# Patient Record
Sex: Female | Born: 1935 | Race: White | Hispanic: No | Smoking: Never smoker
Health system: Southern US, Community
[De-identification: ages and names within clinical notes are randomized; demographics above are authoritative.]

## PROBLEM LIST (undated history)

## (undated) DIAGNOSIS — E119 Type 2 diabetes mellitus without complications: Secondary | ICD-10-CM

## (undated) HISTORY — PX: ABDOMINAL SURGERY: SHX537

## (undated) HISTORY — DX: Type 2 diabetes mellitus without complications: E11.9

---

## 2010-04-26 DIAGNOSIS — H524 Presbyopia: Secondary | ICD-10-CM | POA: Insufficient documentation

## 2010-05-11 DIAGNOSIS — Z961 Presence of intraocular lens: Secondary | ICD-10-CM | POA: Insufficient documentation

## 2010-05-11 DIAGNOSIS — H52209 Unspecified astigmatism, unspecified eye: Secondary | ICD-10-CM | POA: Insufficient documentation

## 2011-12-17 DIAGNOSIS — H35419 Lattice degeneration of retina, unspecified eye: Secondary | ICD-10-CM | POA: Insufficient documentation

## 2017-03-14 DIAGNOSIS — E782 Mixed hyperlipidemia: Secondary | ICD-10-CM | POA: Insufficient documentation

## 2017-04-16 DIAGNOSIS — K7689 Other specified diseases of liver: Secondary | ICD-10-CM | POA: Insufficient documentation

## 2017-11-28 DIAGNOSIS — R32 Unspecified urinary incontinence: Secondary | ICD-10-CM | POA: Insufficient documentation

## 2017-11-28 DIAGNOSIS — M545 Low back pain, unspecified: Secondary | ICD-10-CM | POA: Insufficient documentation

## 2017-11-28 DIAGNOSIS — R011 Cardiac murmur, unspecified: Secondary | ICD-10-CM | POA: Insufficient documentation

## 2017-11-28 DIAGNOSIS — G8929 Other chronic pain: Secondary | ICD-10-CM | POA: Insufficient documentation

## 2017-11-28 DIAGNOSIS — M2240 Chondromalacia patellae, unspecified knee: Secondary | ICD-10-CM | POA: Insufficient documentation

## 2018-03-11 ENCOUNTER — Ambulatory Visit (INDEPENDENT_AMBULATORY_CARE_PROVIDER_SITE_OTHER): Payer: Medicare Other | Admitting: Osteopathic Medicine

## 2018-03-11 ENCOUNTER — Encounter: Payer: Self-pay | Admitting: Osteopathic Medicine

## 2018-03-11 VITALS — BP 134/61 | HR 88 | Temp 98.8°F | Ht 63.0 in | Wt 163.6 lb

## 2018-03-11 DIAGNOSIS — R443 Hallucinations, unspecified: Secondary | ICD-10-CM | POA: Diagnosis not present

## 2018-03-11 DIAGNOSIS — G3109 Other frontotemporal dementia: Secondary | ICD-10-CM | POA: Diagnosis not present

## 2018-03-11 DIAGNOSIS — R413 Other amnesia: Secondary | ICD-10-CM | POA: Diagnosis not present

## 2018-03-11 DIAGNOSIS — E1165 Type 2 diabetes mellitus with hyperglycemia: Secondary | ICD-10-CM

## 2018-03-11 DIAGNOSIS — N3001 Acute cystitis with hematuria: Secondary | ICD-10-CM | POA: Diagnosis not present

## 2018-03-11 LAB — POCT URINALYSIS DIPSTICK
Glucose, UA: POSITIVE — AB
Ketones, UA: 15
NITRITE UA: POSITIVE
Protein, UA: POSITIVE — AB
Spec Grav, UA: 1.02 (ref 1.010–1.025)
Urobilinogen, UA: 1 E.U./dL
pH, UA: 7 (ref 5.0–8.0)

## 2018-03-11 MED ORDER — PREGABALIN 75 MG PO CAPS: 75.0000 mg | ORAL_CAPSULE | Freq: Two times a day (BID) | ORAL | 0 refills | Status: DC

## 2018-03-11 MED ORDER — CIPROFLOXACIN HCL 500 MG PO TABS: 500.0000 mg | ORAL_TABLET | Freq: Two times a day (BID) | ORAL | 0 refills | Status: DC

## 2018-03-11 NOTE — Patient Instructions (Addendum)
Plan: Blood work and urine culture Will start antibiotics for UTI Will schedule MRI brain  Will work on getting an appointment with a specialist Will request records from psychiatric evaluation  I'd like to see you back to discuss results and next steps

## 2018-03-11 NOTE — Progress Notes (Signed)
HPI: Stacy Mendoza is a 84 y.o. female who  has no past medical history on file.  she presents to [facility] today, 03/11/18,  for chief complaint of: New to establish - see headings below   NEUROLOGICAL/PSYCH Concern for hallucinations/confusion seem to happen at times of stress. She sees things tha aren't there, she has paranoid tendencies. History of anxiety. No other mental health history.    Son accompanies patient to appointment today.  He describes periods of time where she will hear or see people in the house that are not there.  There was a recent incident where they left her at home by herself while the rest of the family went out to the movies, she states that she saw her grandson in the house, he was not there.  She states she stayed in her room and was just watching out the window, noting about 20 cars parked outside.  Son has asked her to not open doors when they are not home, she states that she has not but they have her on camera opening and closing the front door about a dozen times  Have also been incidents of some kind of "breakdown" when she was living by herself in a trailer across from her other son in [place].  At that time was taken to what sounds like emergency department, admitted for psychiatric evaluation.  I am currently awaiting those records.   CARDIOVASCULAR Reports Hx abn heart rhythm, unknown diagnosis. HLD.   RESPIRATORY Coughing on occasion.   RENAL Last BUN/Cr ok 11/2017.   URINARY Frequency. Ongoing for better part of 6 months. Blood in urine is new today. No fever, no lower abdominal pain or flank pain.    GASTROINTESTINAL Dysphagia, "food sticking" sensation in throat Following with Dr. Conley Rolls for liver cyst and abdominal pain.  Stacy Carwin, MD  [address]  [address]  [place], [place] [address]  Phone: [phone]  Fax: [phone]  ENDOCRINE DM2 - last A1C 11/2017 was 8.5%, 03/2017 was 5.9%. Pt  is on Metformin and long acting insulin 6 units daily.   MUSCULOSKELETAL/RHEUM  HEENT Red splash in eyes, trouble with reading. Sees eye doctor   SKIN Redness on R big toe  HEME Last CBC ok 11/2017     Past medical, surgical, social and family history reviewed:  Patient Active Problem List   Diagnosis Date Noted  . Diabetes type 2, uncontrolled (HCC) 03/12/2018  . Chondromalacia patellae 11/28/2017  . Chronic left-sided low back pain 11/28/2017  . Heart murmur 11/28/2017  . Urinary incontinence 11/28/2017  . Liver cyst 04/16/2017  . Mixed hyperlipidemia 03/14/2017  . Retinal lattice degeneration 12/17/2011  . Astigmatism 05/11/2010  . History of artificial eye lens 05/11/2010  . Presbyopia 04/26/2010    Past Surgical History:  Procedure Laterality Date  . ABDOMINAL SURGERY      Social History   Tobacco Use  . Smoking status: Never Smoker  . Smokeless tobacco: Never Used  Substance Use Topics  . Alcohol use: Never    Frequency: Never    History reviewed. No pertinent family history.   Current medication list and allergy/intolerance information reviewed:    Current Meds  Medication Sig  . alendronate (FOSAMAX) 70 MG tablet Take by mouth.  . baclofen (LIORESAL) 10 MG tablet TAKE 1/2 TABLET AT BEDTIME  . DULoxetine (CYMBALTA) 60 MG capsule TAKE ONE CAPSULE BY MOUTH TWICE A DAY  . Insulin Glargine (LANTUS SOLOSTAR) 100 UNIT/ML  Solostar Pen INJECT 20 UNITS AT BEDTIME  . metFORMIN (GLUCOPHAGE-XR) 500 MG 24 hr tablet TAKE TWO (2) TABLETS BY MOUTH EVERY MORNING WITH BREAKFAST  . metoprolol succinate (TOPROL-XL) 25 MG 24 hr tablet TAKE ONE (1) TABLET BY MOUTH EACH DAY  . potassium chloride (K-DUR) 10 MEQ tablet TAKE ONE (1) TABLET BY MOUTH EACH DAY  . pregabalin (LYRICA) 75 MG capsule Take 1 capsule (75 mg total) by mouth 2 (two) times daily.  . rOPINIRoleMarland Kitchen (REQUIP) 2 MG tablet TAKE TWO TABLETS BY MOUTH AT BEDTIME DAILY  . simvastatin (ZOCOR) 20 MG tablet TAKE 1  TABLET BY MOUTH AT BEDTIME  . [DISCONTINUED] pregabalin (LYRICA) 75 MG capsule TAKE ONE (1) CAPSULE BY MOUTH 2 TIMES DAILY     No Known Allergies    Review of Systems:  Constitutional:  No  fever, no chills, No recent illness  HEENT: No  headache, no vision change, no hearing change, No sore throat, No  sinus pressure  Cardiac: No  chest pain, No  pressure, No palpitations  Respiratory:  No  shortness of breath. No  Cough  Gastrointestinal: No  abdominal pain, No  nausea, No  vomiting,  No  blood in stool, No  diarrhea, No  constipation   Musculoskeletal: No new myalgia/arthralgia  Genitourinary: +incontinence, No  abnormal genital bleeding, No abnormal genital discharge, +hematuria, +frequency  Hem/Onc: No  easy bruising/bleeding  Endocrine: No cold intolerance,  No heat intolerance. No polyuria/polydipsia/polyphagia   Neurologic: No  weakness, No  dizziness, No  slurred speech/focal weakness/facial droop  Psychiatric: No  concerns with depression, No  concerns with anxiety, No sleep problems, No mood problems  Exam:  BP 134/61 (BP Location: Left Arm, Patient Position: Sitting, Cuff Size: Normal)   Pulse 88   Temp 98.8 F (37.1 C) (Oral)   Ht 5\' 3"  (1.6 m)   Wt 163 lb 9.6 oz (74.2 kg)   BMI 28.98 kg/m   Constitutional: VS see above. General Appearance: alert, well-developed, well-nourished, NAD  Eyes: Normal lids and conjunctive, non-icteric sclera  Ears, Nose, Mouth, Throat: MMM, Normal external inspection ears/nares/mouth/lips/gums.   Neck: No masses, trachea midline. No thyroid enlargement. No tenderness/mass appreciated. No lymphadenopathy  Respiratory: Normal respiratory effort. no wheeze, no rhonchi, no rales  Cardiovascular: S1/S2 normal, +3-6 systolic murmur, no rub/gallop auscultated. RRR. No lower extremity edema.   Gastrointestinal: Nontender, no masses  Musculoskeletal: Gait normal. No clubbing/cyanosis of digits.   Neurological: Normal  balance/coordination. No tremor. No cranial nerve deficit on limited exam. Motor and sensation intact and symmetric. Cerebellar reflexes intact. Tic-like movements of mouth almost constantly.   Skin: warm, dry, intact.   Psychiatric: Poor judgment/insight. Normal mood and affect. Oriented xperson, place. She is pleasant, cooperative, does not appear confused now. S    Results for orders placed or performed in visit on 03/11/18 (from the past 24 hour(s))  POCT Urinalysis Dipstick     Status: Abnormal   Collection Time: 03/11/18  2:38 PM  Result Value Ref Range   Color, UA RED    Clarity, UA TURBID    Glucose, UA Positive (A) Negative   Bilirubin, UA LARGE    Ketones, UA 15    Spec Grav, UA 1.020 1.010 - 1.025   Blood, UA LARGE    pH, UA 7.0 5.0 - 8.0   Protein, UA Positive (A) Negative   Urobilinogen, UA 1.0 0.2 or 1.0 E.U./dL   Nitrite, UA POSITIVE    Leukocytes, UA Large (3+) (A) Negative  Appearance     Odor    CBC with Differential/Platelet     Status: Abnormal   Collection Time: 03/11/18  3:04 PM  Result Value Ref Range   WBC 13.9 (H) 3.8 - 10.8 Thousand/uL   RBC 4.74 3.80 - 5.10 Million/uL   Hemoglobin 13.6 11.7 - 15.5 g/dL   HCT 16.140.8 09.635.0 - 04.545.0 %   MCV 86.1 80.0 - 100.0 fL   MCH 28.7 27.0 - 33.0 pg   MCHC 33.3 32.0 - 36.0 g/dL   RDW 40.913.7 81.111.0 - 91.415.0 %   Platelets 276 140 - 400 Thousand/uL   MPV 10.7 7.5 - 12.5 fL   Neutro Abs 10,439 (H) 1,500 - 7,800 cells/uL   Lymphs Abs 2,405 850 - 3,900 cells/uL   Absolute Monocytes 765 200 - 950 cells/uL   Eosinophils Absolute 195 15 - 500 cells/uL   Basophils Absolute 97 0 - 200 cells/uL   Neutrophils Relative % 75.1 %   Total Lymphocyte 17.3 %   Monocytes Relative 5.5 %   Eosinophils Relative 1.4 %   Basophils Relative 0.7 %  COMPLETE METABOLIC PANEL WITH GFR     Status: Abnormal   Collection Time: 03/11/18  3:04 PM  Result Value Ref Range   Glucose, Bld 272 (H) 65 - 99 mg/dL   BUN 17 7 - 25 mg/dL   Creat 7.821.01 (H)  9.560.60 - 0.88 mg/dL   GFR, Est Non African American 52 (L) > OR = 60 mL/min/1.3173m2   GFR, Est African American 60 > OR = 60 mL/min/1.7573m2   BUN/Creatinine Ratio 17 6 - 22 (calc)   Sodium 137 135 - 146 mmol/L   Potassium 4.2 3.5 - 5.3 mmol/L   Chloride 101 98 - 110 mmol/L   CO2 23 20 - 32 mmol/L   Calcium 9.0 8.6 - 10.4 mg/dL   Total Protein 7.0 6.1 - 8.1 g/dL   Albumin 4.1 3.6 - 5.1 g/dL   Globulin 2.9 1.9 - 3.7 g/dL (calc)   AG Ratio 1.4 1.0 - 2.5 (calc)   Total Bilirubin 1.4 (H) 0.2 - 1.2 mg/dL   Alkaline phosphatase (APISO) 82 33 - 130 U/L   AST 19 10 - 35 U/L   ALT 8 6 - 29 U/L  TSH     Status: None   Collection Time: 03/11/18  3:04 PM  Result Value Ref Range   TSH 1.40 0.40 - 4.50 mIU/L  T4, free     Status: None   Collection Time: 03/11/18  3:04 PM  Result Value Ref Range   Free T4 1.1 0.8 - 1.8 ng/dL  Hemoglobin O1HA1c     Status: Abnormal   Collection Time: 03/11/18  3:04 PM  Result Value Ref Range   Hgb A1c MFr Bld 12.3 (H) <5.7 % of total Hgb   Mean Plasma Glucose 306 (calc)   eAG (mmol/L) 17.0 (calc)  Vitamin B12     Status: None   Collection Time: 03/11/18  3:04 PM  Result Value Ref Range   Vitamin B-12 448 200 - 1,100 pg/mL  Sedimentation rate     Status: None   Collection Time: 03/11/18  3:04 PM  Result Value Ref Range   Sed Rate 25 0 - 30 mm/h      ASSESSMENT/PLAN: The primary encounter diagnosis was Hallucinations. Diagnoses of Acute cystitis with hematuria, Functional memory problem, Other frontotemporal dementia (CODE) (HCC), and Uncontrolled type 2 diabetes mellitus with hyperglycemia (HCC) were also pertinent to this visit.  Patient and her son are very pleasant people, this is an unfortunate case.  Hallucinations are definitely concerning for possible frontotemporal dementia or other organic brain disease, could certainly also be related to UTI, though this seems less likely given the duration of her symptoms.  In the absence of medical/psychiatric history  to explain hallucinations, will await labs and MRI, strong consideration for a geriatric psychiatry and/or neurology.  We will see if symptoms improve with treatment for UTI.  Getting the sugars under control is also going to be a bit of a process.   Orders Placed This Encounter  Procedures  . Urine Culture  . MR Brain Wo Contrast  . CBC with Differential/Platelet  . COMPLETE METABOLIC PANEL WITH GFR  . TSH  . T4, free  . Hemoglobin A1c  . Hepatitis C antibody  . Vitamin B12  . Folate RBC  . RPR  . HIV Antibody (routine testing w rflx)  . Sedimentation rate  . POCT Urinalysis Dipstick    Meds ordered this encounter  Medications  . pregabalin (LYRICA) 75 MG capsule    Sig: Take 1 capsule (75 mg total) by mouth 2 (two) times daily.    Dispense:  60 capsule    Refill:  0  . ciprofloxacin (CIPRO) 500 MG tablet    Sig: Take 1 tablet (500 mg total) by mouth 2 (two) times daily.    Dispense:  20 tablet    Refill:  0    Patient Instructions  Plan: Blood work and urine culture Will start antibiotics for UTI Will schedule MRI brain  Will work on getting an appointment with a specialist Will request records from psychiatric evaluation  I'd like to see you back to discuss results and next steps     Result nore: titrate up on insulin       Visit summary with medication list and pertinent instructions was printed for patient to review. All questions at time of visit were answered - patient instructed to contact office with any additional concerns or updates. ER/RTC precautions were reviewed with the patient.   Note: Total time spent 60 minutes, greater than 50% of the visit was spent face-to-face counseling and coordinating care for the above diagnoses listed in assessment/plan.   Please note: voice recognition software was used to produce this document, and typos may escape review. Please contact Dr. Lyn Hollingshead for any needed clarifications.     Follow-up plan: Return in  about 2 weeks (around 03/25/2018) for review results - see me sooner if needed .

## 2018-03-12 ENCOUNTER — Encounter: Payer: Self-pay | Admitting: Osteopathic Medicine

## 2018-03-12 DIAGNOSIS — E1165 Type 2 diabetes mellitus with hyperglycemia: Secondary | ICD-10-CM | POA: Insufficient documentation

## 2018-03-12 DIAGNOSIS — IMO0002 Reserved for concepts with insufficient information to code with codable children: Secondary | ICD-10-CM | POA: Insufficient documentation

## 2018-03-12 LAB — CBC WITH DIFFERENTIAL/PLATELET
Absolute Monocytes: 765 cells/uL (ref 200–950)
Basophils Absolute: 97 cells/uL (ref 0–200)
Basophils Relative: 0.7 %
EOS PCT: 1.4 %
Eosinophils Absolute: 195 cells/uL (ref 15–500)
HCT: 40.8 % (ref 35.0–45.0)
Hemoglobin: 13.6 g/dL (ref 11.7–15.5)
Lymphs Abs: 2405 cells/uL (ref 850–3900)
MCH: 28.7 pg (ref 27.0–33.0)
MCHC: 33.3 g/dL (ref 32.0–36.0)
MCV: 86.1 fL (ref 80.0–100.0)
MPV: 10.7 fL (ref 7.5–12.5)
Monocytes Relative: 5.5 %
Neutro Abs: 10439 cells/uL — ABNORMAL HIGH (ref 1500–7800)
Neutrophils Relative %: 75.1 %
Platelets: 276 10*3/uL (ref 140–400)
RBC: 4.74 10*6/uL (ref 3.80–5.10)
RDW: 13.7 % (ref 11.0–15.0)
Total Lymphocyte: 17.3 %
WBC: 13.9 10*3/uL — ABNORMAL HIGH (ref 3.8–10.8)

## 2018-03-12 LAB — RPR: RPR Ser Ql: NONREACTIVE

## 2018-03-12 LAB — COMPLETE METABOLIC PANEL WITH GFR
AG Ratio: 1.4 (calc) (ref 1.0–2.5)
ALBUMIN MSPROF: 4.1 g/dL (ref 3.6–5.1)
ALT: 8 U/L (ref 6–29)
AST: 19 U/L (ref 10–35)
Alkaline phosphatase (APISO): 82 U/L (ref 33–130)
BUN / CREAT RATIO: 17 (calc) (ref 6–22)
BUN: 17 mg/dL (ref 7–25)
CO2: 23 mmol/L (ref 20–32)
Calcium: 9 mg/dL (ref 8.6–10.4)
Chloride: 101 mmol/L (ref 98–110)
Creat: 1.01 mg/dL — ABNORMAL HIGH (ref 0.60–0.88)
GFR, Est African American: 60 mL/min/{1.73_m2} (ref >=60)
GFR, Est Non African American: 52 mL/min/{1.73_m2} — ABNORMAL LOW (ref >=60)
GLOBULIN: 2.9 g/dL (ref 1.9–3.7)
Glucose, Bld: 272 mg/dL — ABNORMAL HIGH (ref 65–99)
Potassium: 4.2 mmol/L (ref 3.5–5.3)
Sodium: 137 mmol/L (ref 135–146)
Total Bilirubin: 1.4 mg/dL — ABNORMAL HIGH (ref 0.2–1.2)
Total Protein: 7 g/dL (ref 6.1–8.1)

## 2018-03-12 LAB — T4, FREE: Free T4: 1.1 ng/dL (ref 0.8–1.8)

## 2018-03-12 LAB — HEPATITIS C ANTIBODY
Hepatitis C Ab: NONREACTIVE
SIGNAL TO CUT-OFF: 0.02 (ref <1.00)

## 2018-03-12 LAB — SEDIMENTATION RATE: Sed Rate: 25 mm/h (ref 0–30)

## 2018-03-12 LAB — HIV ANTIBODY (ROUTINE TESTING W REFLEX): HIV 1&2 Ab, 4th Generation: NONREACTIVE

## 2018-03-12 LAB — HEMOGLOBIN A1C
Hgb A1c MFr Bld: 12.3 % of total Hgb — ABNORMAL HIGH (ref <5.7)
Mean Plasma Glucose: 306 (calc)
eAG (mmol/L): 17 (calc)

## 2018-03-12 LAB — VITAMIN B12: Vitamin B-12: 448 pg/mL (ref 200–1100)

## 2018-03-12 LAB — FOLATE RBC: RBC Folate: 760 ng/mL RBC (ref >280)

## 2018-03-12 LAB — TSH: TSH: 1.4 mIU/L (ref 0.40–4.50)

## 2018-03-13 LAB — URINE CULTURE
MICRO NUMBER:: 54369
SPECIMEN QUALITY:: ADEQUATE

## 2018-03-17 ENCOUNTER — Ambulatory Visit (INDEPENDENT_AMBULATORY_CARE_PROVIDER_SITE_OTHER): Payer: Medicare Other

## 2018-03-17 DIAGNOSIS — G3109 Other frontotemporal dementia: Secondary | ICD-10-CM

## 2018-03-24 ENCOUNTER — Telehealth: Payer: Self-pay

## 2018-03-24 NOTE — Telephone Encounter (Signed)
Pt's son Nida Boatman called requesting a med refill for lantus solostar. Written by historical provider. Pls send rx to [facility] in [place].

## 2018-03-26 MED ORDER — INSULIN GLARGINE 100 UNIT/ML SOLOSTAR PEN: 20.0000 [IU] | PEN_INJECTOR | Freq: Every day | SUBCUTANEOUS | 99 refills | Status: DC

## 2018-03-26 NOTE — Telephone Encounter (Signed)
Sent!

## 2018-03-26 NOTE — Telephone Encounter (Signed)
Left a detailed vm msg for pt's son Nida Boatman regarding med refill for insulin pens. Direct call back info provided.

## 2018-04-01 ENCOUNTER — Ambulatory Visit: Payer: Medicare Other | Admitting: Osteopathic Medicine

## 2018-04-01 ENCOUNTER — Telehealth: Payer: Self-pay | Admitting: Osteopathic Medicine

## 2018-04-01 NOTE — Telephone Encounter (Signed)
Patient walked in at 3:40pm. Son rescheduled  appointment.

## 2018-04-09 ENCOUNTER — Ambulatory Visit: Payer: Medicare Other | Admitting: Osteopathic Medicine

## 2018-04-22 ENCOUNTER — Encounter: Payer: Self-pay | Admitting: Osteopathic Medicine

## 2018-04-22 ENCOUNTER — Ambulatory Visit (INDEPENDENT_AMBULATORY_CARE_PROVIDER_SITE_OTHER): Payer: Medicare Other | Admitting: Osteopathic Medicine

## 2018-04-22 ENCOUNTER — Ambulatory Visit (INDEPENDENT_AMBULATORY_CARE_PROVIDER_SITE_OTHER): Payer: Medicare Other

## 2018-04-22 VITALS — BP 132/68 | HR 72 | Temp 99.0°F | Wt 171.0 lb

## 2018-04-22 DIAGNOSIS — R0602 Shortness of breath: Secondary | ICD-10-CM | POA: Diagnosis not present

## 2018-04-22 DIAGNOSIS — I10 Essential (primary) hypertension: Secondary | ICD-10-CM | POA: Diagnosis not present

## 2018-04-22 DIAGNOSIS — B351 Tinea unguium: Secondary | ICD-10-CM | POA: Diagnosis not present

## 2018-04-22 DIAGNOSIS — R410 Disorientation, unspecified: Secondary | ICD-10-CM

## 2018-04-22 DIAGNOSIS — F0390 Unspecified dementia without behavioral disturbance: Secondary | ICD-10-CM | POA: Insufficient documentation

## 2018-04-22 DIAGNOSIS — F0281 Dementia in other diseases classified elsewhere with behavioral disturbance: Secondary | ICD-10-CM

## 2018-04-22 DIAGNOSIS — G3109 Other frontotemporal dementia: Secondary | ICD-10-CM

## 2018-04-22 DIAGNOSIS — R0600 Dyspnea, unspecified: Secondary | ICD-10-CM | POA: Diagnosis not present

## 2018-04-22 DIAGNOSIS — N3001 Acute cystitis with hematuria: Secondary | ICD-10-CM | POA: Diagnosis not present

## 2018-04-22 DIAGNOSIS — E1165 Type 2 diabetes mellitus with hyperglycemia: Secondary | ICD-10-CM

## 2018-04-22 DIAGNOSIS — L03031 Cellulitis of right toe: Secondary | ICD-10-CM

## 2018-04-22 MED ORDER — PREGABALIN 75 MG PO CAPS: 75.0000 mg | ORAL_CAPSULE | Freq: Two times a day (BID) | ORAL | 5 refills | Status: AC

## 2018-04-22 MED ORDER — SIMVASTATIN 20 MG PO TABS: 20.0000 mg | ORAL_TABLET | Freq: Every day | ORAL | 3 refills | Status: AC

## 2018-04-22 MED ORDER — FLUCONAZOLE 150 MG PO TABS: 150.0000 mg | ORAL_TABLET | Freq: Once | ORAL | 1 refills | Status: AC

## 2018-04-22 MED ORDER — METFORMIN HCL ER 500 MG PO TB24: ORAL_TABLET | ORAL | 3 refills | Status: AC

## 2018-04-22 MED ORDER — ALENDRONATE SODIUM 70 MG PO TABS: 70.0000 mg | ORAL_TABLET | ORAL | 3 refills | Status: AC

## 2018-04-22 MED ORDER — TERBINAFINE HCL 1 % EX CREA: TOPICAL_CREAM | CUTANEOUS | 3 refills | Status: AC

## 2018-04-22 MED ORDER — POTASSIUM CHLORIDE ER 10 MEQ PO TBCR: 10.0000 meq | EXTENDED_RELEASE_TABLET | Freq: Every day | ORAL | 3 refills | Status: AC

## 2018-04-22 MED ORDER — CICLOPIROX 8 % EX SOLN: CUTANEOUS | 11 refills | Status: AC

## 2018-04-22 MED ORDER — METOPROLOL SUCCINATE ER 25 MG PO TB24: 25.0000 mg | ORAL_TABLET | Freq: Every day | ORAL | 3 refills | Status: AC

## 2018-04-22 MED ORDER — INSULIN GLARGINE 100 UNIT/ML SOLOSTAR PEN: PEN_INJECTOR | SUBCUTANEOUS | 99 refills | Status: AC

## 2018-04-22 MED ORDER — DULOXETINE HCL 60 MG PO CPEP: 60.0000 mg | ORAL_CAPSULE | Freq: Two times a day (BID) | ORAL | 3 refills | Status: AC

## 2018-04-22 MED ORDER — ROPINIROLE HCL 2 MG PO TABS: ORAL_TABLET | ORAL | 3 refills | Status: AC

## 2018-04-22 MED ORDER — SULFAMETHOXAZOLE-TRIMETHOPRIM 800-160 MG PO TABS: 1.0000 | ORAL_TABLET | Freq: Two times a day (BID) | ORAL | 0 refills | Status: AC

## 2018-04-22 NOTE — Progress Notes (Signed)
HPI: Stacy Mendoza is a 84 y.o. female who  has a past medical history of Diabetes (HCC).  [facility], 04/22/18,  for chief complaint of:  Follow-up: confusion/hallucinations, UTI New: vaginal itching, rash on R great toe, shortness of breath  Confusion/hallucinations, UTI: Proteus, tx Cipro.  Son reports hallucinations have essentially resolved but she still experiences confusion, agitation, waking up in the middle of the night and not knowing what time it is, he has to manage all of her medications.  Recent lab results, consistent with UTI, CKD versus AKI, proteinuria.  MRI showed evidence of frontotemporal dementia and micro vascular chronic change, no previous CVA  Vaginal itching: Reports itching/burning sensation, no urinary frequency.  Longstanding incontinence, patient feels urge to go, some stress incontinence as well.   Rash on right great toe, getting worse over the past week or so, she has neuropathy and cannot feel her feet very well but she does examine these regularly.  No ulcerations or wounds.  Shortness of breath, she is worried since her grandkids have recently been sick, no fever, she reports increased coughing.  Non-smoker and no history of occupational respiratory irritant exposure.  Diabetes: She is taking insulin 10 units daily, not checking blood glucose.  Recent A1c on labs greater than 12.       At today's visit 04/22/18 ... PMH, PSH, FH reviewed and updated as needed.  Current medication list and allergy/intolerance hx reviewed and updated as needed. (See remainder of HPI, ROS, Phys Exam below)   EKG interpretation: Rate: 72  Rhythm: sinus w/ 1st degree AVB No ST/T changes concerning for acute ischemia/infarct  Previous EKG none available        ASSESSMENT/PLAN: The primary encounter diagnosis was Acute cystitis with hematuria. Diagnoses of Delirium, Other frontotemporal dementia with  behavioral disturbance (HCC), Onychomycosis, Cellulitis of right toe, Uncontrolled type 2 diabetes mellitus with hyperglycemia (HCC), Dyspnea, unspecified type, and Shortness of breath were also pertinent to this visit.   Orders Placed This Encounter  Procedures  . Urine Culture  . DG Chest 2 View  . Urinalysis, microscopic only  . ECHOCARDIOGRAM COMPLETE  . Rhythm ECG, report     New or changed meds in bold  Meds ordered this encounter  Medications  . sulfamethoxazole-trimethoprim (BACTRIM DS,SEPTRA DS) 800-160 MG tablet    Sig: Take 1 tablet by mouth 2 (two) times daily.    Dispense:  14 tablet    Refill:  0  . fluconazole (DIFLUCAN) 150 MG tablet    Sig: Take 1 tablet (150 mg total) by mouth once for 1 dose. Repeat dose 72 hours if yeast infection persists    Dispense:  2 tablet    Refill:  1  . alendronate (FOSAMAX) 70 MG tablet    Sig: Take 1 tablet (70 mg total) by mouth once a week.    Dispense:  12 tablet    Refill:  3  . DULoxetine (CYMBALTA) 60 MG capsule    Sig: Take 1 capsule (60 mg total) by mouth 2 (two) times daily.    Dispense:  90 capsule    Refill:  3  . Insulin Glargine (LANTUS SOLOSTAR) 100 UNIT/ML Solostar Pen    Sig: Titrate up as directed to max 60 units    Dispense:  15 mL    Refill:  99  . metFORMIN (GLUCOPHAGE-XR) 500 MG 24 hr tablet    Sig: TAKE TWO (2) TABLETS BY MOUTH EVERY MORNING  WITH BREAKFAST    Dispense:  180 tablet    Refill:  3  . metoprolol succinate (TOPROL-XL) 25 MG 24 hr tablet    Sig: Take 1 tablet (25 mg total) by mouth daily.    Dispense:  90 tablet    Refill:  3  . potassium chloride (K-DUR) 10 MEQ tablet    Sig: Take 1 tablet (10 mEq total) by mouth daily.    Dispense:  90 tablet    Refill:  3  . pregabalin (LYRICA) 75 MG capsule    Sig: Take 1 capsule (75 mg total) by mouth 2 (two) times daily.    Dispense:  60 capsule    Refill:  5  . rOPINIRole (REQUIP) 2 MG tablet    Sig: TAKE TWO TABLETS BY MOUTH AT BEDTIME DAILY     Dispense:  180 tablet    Refill:  3  . simvastatin (ZOCOR) 20 MG tablet    Sig: Take 1 tablet (20 mg total) by mouth at bedtime.    Dispense:  90 tablet    Refill:  3  . ciclopirox (PENLAC) 8 % solution    Sig: Apply over nail and surrounding skin daily over previous coat. After seven (7) days, may remove with alcohol and continue cycle.    Dispense:  6.6 mL    Refill:  11  . terbinafine (LAMISIL) 1 % cream    Sig: Apply to affected area BID until rash gone, then apply 2 more days.    Dispense:  30 g    Refill:  3    Patient Instructions  For Diabetes  Continue Metformin as you are taking it  Measure fasting blood sugar every day: goal for now is to get this to 100-130  Increase daily Insulin dose by 3 units at a time, twice per week, until fasting sugars are consistently 100-130, then continue at that dose  Plan to recheck A1C in 3 months  Plan to follow-up in the office sooner if you experience low sugars   Plan to follow-up in the office sooner if your fasting sugars are consistently high  Bring all sugar readings with you to your office visits  For feet:  Antibiotics (sulfamethoxazole-trimethoprim) for cellulitis infection of toe  Ciclopirox polish to nails for nail fungus, may require several months of treatment  Lamisil cream to skin around toes, use for 2-4 weeks   Yeast infection:  Sent Diflucan to pharmacy   Breathing:  Will schedule echocardiogram (ultrasound of the heart)  Will get Xray today   Consider follow-up lung function test   Refilled all other medications             Frontotemporal Dementia  Frontotemporal dementia (FTD) is a rare, progressive brain disorder that causes memory loss. FTD describes a range of diseases that often start with changes in behavior, speech, and thinking. As FTD progresses, it affects short-term memory. Over time, FTD causes the frontal and temporal anterior lobes of the brain to shrink. These are the  parts of the brain that control behavior and speech. There are three main types of FTD:  Behavioral variant FTD. This is the most common type and was previously called Pick disease.  Progressive agrammatic aphasia.  Semantic variant primary progressive aphasia. FTD is one of the most common causes of progressive memory loss in people younger than 60 years. The disease progresses faster in some people than in others. In some families, FTD can be associated with amyotrophic lateral sclerosis (  ALS). There is no cure for FTD, but treatment and supportive care can improve a person's quality of life. What are the causes? The exact cause of this condition is not known, although many genetic mutations are known to cause the disease. What increases the risk? This condition is more likely to occur in people who have a family history of FTD. Family members of people with FTD should think about genetic counseling. What are the signs or symptoms? Signs and symptoms of FTD usually start between the ages of 55-65 years. Symptoms may include:  Impulsive, poorly controlled, inappropriate, or embarrassing behavior.  Lack of motivation and interest (apathy).  Irritability and agitation.  Neglect of personal hygiene.  Withdrawal.  Inappropriate crying or laughing (pseudobulbar affect).  Repetitive behaviors.  Impulsive eating  Lack of concern for others.  Failure to recognize behavioral changes.  Loss of the ability to speak fluently.  Inability to write or read.  Slurred speech.  Loss of vocabulary.  New drug or alcohol abuse. Short-term memory loss is also a symptom later in the disease. How is this diagnosed? Your health care provider may suspect FTD if you have worsening behavior or speech difficulties. You may need to see specialists in brain and behavioral health who will collect your medical history and do a neurological examination. The following tests may be done:  Blood tests  to rule out other causes, like vitamin deficiency, harmful effects of substances (toxicities), and infections.  Spinal tap (lumbar puncture) to check spinal fluid samples for abnormal proteins.  Imaging tests, such as a CT scan, PET scan, or MRI. These can show brain changes that suggest FTD or another brain disorder.  Memory testing (neuropsychological testing), which involves several hours of standardized tests to check the many functions of the brain. How is this treated? There is no cure for this condition and the progression of FTD cannot be stopped. Support at home is the most important aspect of managing FTD. Ask about caregiving resources in your community. Management of this condition may include:  Antidepressants to help with apathy.  Medicines to treat pseudobulbar affect.  Sedative medicines to control aggressive or dangerous behavior.  Speech and language therapy.  Behavioral therapy.  Occupational therapy to help with home safety and activity. Institutional or supportive care may eventually be needed. Follow these instructions at home: Eating and drinking  Follow a healthy diet. Eat foods that are high in fiber, such as fresh fruits and vegetables, whole grains, and beans.  Avoid having too much sugar and caffeine in your diet.  Watch for signs of compulsive eating, which can lead to other health problems.  Do not drink alcohol.  Drink enough fluid to keep your urine pale yellow. Lifestyle  Keep a regular routine.  Avoid stress and new situations.  Avoid any activities that may trigger aggressive behavior.  Schedule regular, enjoyable, and supervised physical activity. General instructions  Take over-the-counter and prescription medicines only as told by your health care provider.  If you were given a bracelet that tracks your location, make sure you wear it.  Work with your health care provider to determine what you need help with and what your safety  needs are.  Keep all follow-up visits, including any therapy visits, as told by your health care provider. This is important. Contact a health care provider if:  Your symptoms change or get worse.  It becomes more difficult or stressful to be cared for at home. Get help right away if:  It is no longer possible to be cared for at home.  You or your family members become concerned for your safety. Summary  Frontotemporal dementia (FTD) is a rare, progressive brain disorder that causes memory loss.  There is no cure for this condition and the progression of FTD cannot be stopped.  Support at home is the most important aspect of managing FTD. Ask about caregiving resources in your community.  Work with your health care provider to determine what you need help with and what your safety needs are. This information is not intended to replace advice given to you by your health care provider. Make sure you discuss any questions you have with your health care provider. Document Released: 02/02/2002 Document Revised: 05/08/2016 Document Reviewed: 05/08/2016 Elsevier Interactive Patient Education  2019 [employer].      Follow-up plan: Return in about 1 week (around 04/29/2018) for recheck toe and sugars, A1C recheck around 07/21/2018.                                                 ################################################# ################################################# ################################################# #################################################    Current Meds  Medication Sig  . alendronate (FOSAMAX) 70 MG tablet Take 1 tablet (70 mg total) by mouth once a week.  . baclofen (LIORESAL) 10 MG tablet TAKE 1/2 TABLET AT BEDTIME  . DULoxetine (CYMBALTA) 60 MG capsule Take 1 capsule (60 mg total) by mouth 2 (two) times daily.  . Insulin Glargine (LANTUS SOLOSTAR) 100 UNIT/ML Solostar Pen Titrate up as directed  to max 60 units  . metFORMIN (GLUCOPHAGE-XR) 500 MG 24 hr tablet TAKE TWO (2) TABLETS BY MOUTH EVERY MORNING WITH BREAKFAST  . metoprolol succinate (TOPROL-XL) 25 MG 24 hr tablet Take 1 tablet (25 mg total) by mouth daily.  . potassium chloride (K-DUR) 10 MEQ tablet Take 1 tablet (10 mEq total) by mouth daily.  . pregabalin (LYRICA) 75 MG capsule Take 1 capsule (75 mg total) by mouth 2 (two) times daily.  Marland Kitchen rOPINIRole (REQUIP) 2 MG tablet TAKE TWO TABLETS BY MOUTH AT BEDTIME DAILY  . simvastatin (ZOCOR) 20 MG tablet Take 1 tablet (20 mg total) by mouth at bedtime.  . [DISCONTINUED] alendronate (FOSAMAX) 70 MG tablet Take by mouth.  . [DISCONTINUED] ciprofloxacin (CIPRO) 500 MG tablet Take 1 tablet (500 mg total) by mouth 2 (two) times daily.  . [DISCONTINUED] DULoxetine (CYMBALTA) 60 MG capsule TAKE ONE CAPSULE BY MOUTH TWICE A DAY  . [DISCONTINUED] Insulin Glargine (LANTUS SOLOSTAR) 100 UNIT/ML Solostar Pen Inject 20 Units into the skin at bedtime.  . [DISCONTINUED] metFORMIN (GLUCOPHAGE-XR) 500 MG 24 hr tablet TAKE TWO (2) TABLETS BY MOUTH EVERY MORNING WITH BREAKFAST  . [DISCONTINUED] metoprolol succinate (TOPROL-XL) 25 MG 24 hr tablet TAKE ONE (1) TABLET BY MOUTH EACH DAY  . [DISCONTINUED] potassium chloride (K-DUR) 10 MEQ tablet TAKE ONE (1) TABLET BY MOUTH EACH DAY  . [DISCONTINUED] pregabalin (LYRICA) 75 MG capsule Take 1 capsule (75 mg total) by mouth 2 (two) times daily.  . [DISCONTINUED] rOPINIRole (REQUIP) 2 MG tablet TAKE TWO TABLETS BY MOUTH AT BEDTIME DAILY  . [DISCONTINUED] simvastatin (ZOCOR) 20 MG tablet TAKE 1 TABLET BY MOUTH AT BEDTIME    No Known Allergies     Review of Systems:  Constitutional: +recent illness  HEENT: No  headache, no vision change  Cardiac: No  chest pain, No  pressure, No palpitations  Respiratory:  +shortness of breath. +Cough  Gastrointestinal: No  abdominal pain, no change on bowel habits  Musculoskeletal: No new  myalgia/arthralgia  Skin: +Rash  Neurologic: No  weakness, No  Dizziness   Exam:  BP 132/68 (BP Location: Left Arm, Patient Position: Sitting, Cuff Size: Normal)   Pulse 72   Temp 99 F (37.2 C) (Oral)   Wt 171 lb (77.6 kg)   BMI 30.29 kg/m   Constitutional: VS see above. General Appearance: alert, well-developed, well-nourished, NAD  Eyes: Normal lids and conjunctive, non-icteric sclera  Ears, Nose, Mouth, Throat: MMM, Normal external inspection ears/nares/mouth/lips/gums.  Neck: No masses, trachea midline.   Respiratory: Normal respiratory effort. +faine expiratory wheeze on R, no rhonchi, no rales  Cardiovascular: S1/S2 normal, +murmur, no rub/gallop auscultated. RRR.   Musculoskeletal: Gait normal. Symmetric and independent movement of all extremities  Neurological: Normal balance/coordination. No tremor.  Skin: warm, dry.  Onychomycosis of most of her toenails.  Warm, erythematous skin on right great toe  Psychiatric: Normal judgment/insight. Normal mood and affect. Oriented x3.       Visit summary with medication list and pertinent instructions was printed for patient to review, patient was advised to alert [place] if any updates are needed. All questions at time of visit were answered - patient instructed to contact office with any additional concerns. ER/RTC precautions were reviewed with the patient and understanding verbalized.   Note: Total time spent 40 minutes, greater than 50% of the visit was spent face-to-face counseling and coordinating care for the following: The primary encounter diagnosis was Acute cystitis with hematuria. Diagnoses of Delirium, Other frontotemporal dementia with behavioral disturbance (HCC), Onychomycosis, Cellulitis of right toe, Uncontrolled type 2 diabetes mellitus with hyperglycemia (HCC), Dyspnea, unspecified type, and Shortness of breath were also pertinent to this visit.Marland Kitchen  Please note: voice recognition software was used to produce  this document, and typos may escape review. Please contact Dr. Lyn Hollingshead for any needed clarifications.    Follow up plan: Return in about 1 week (around 04/29/2018) for recheck toe and sugars, A1C recheck around 07/21/2018.

## 2018-04-22 NOTE — Patient Instructions (Addendum)
For Diabetes  Continue Metformin as you are taking it  Measure fasting blood sugar every day: goal for now is to get this to 100-130  Increase daily Insulin dose by 3 units at a time, twice per week, until fasting sugars are consistently 100-130, then continue at that dose  Plan to recheck A1C in 3 months  Plan to follow-up in the office sooner if you experience low sugars   Plan to follow-up in the office sooner if your fasting sugars are consistently high  Bring all sugar readings with you to your office visits  For feet:  Antibiotics (sulfamethoxazole-trimethoprim) for cellulitis infection of toe  Ciclopirox polish to nails for nail fungus, may require several months of treatment  Lamisil cream to skin around toes, use for 2-4 weeks   Yeast infection:  Sent Diflucan to pharmacy   Breathing:  Will schedule echocardiogram (ultrasound of the heart)  Will get Xray today   Consider follow-up lung function test   Refilled all other medications             Frontotemporal Dementia  Frontotemporal dementia (FTD) is a rare, progressive brain disorder that causes memory loss. FTD describes a range of diseases that often start with changes in behavior, speech, and thinking. As FTD progresses, it affects short-term memory. Over time, FTD causes the frontal and temporal anterior lobes of the brain to shrink. These are the parts of the brain that control behavior and speech. There are three main types of FTD:  Behavioral variant FTD. This is the most common type and was previously called Pick disease.  Progressive agrammatic aphasia.  Semantic variant primary progressive aphasia. FTD is one of the most common causes of progressive memory loss in people younger than 60 years. The disease progresses faster in some people than in others. In some families, FTD can be associated with amyotrophic lateral sclerosis (ALS). There is no cure for FTD, but treatment and supportive  care can improve a person's quality of life. What are the causes? The exact cause of this condition is not known, although many genetic mutations are known to cause the disease. What increases the risk? This condition is more likely to occur in people who have a family history of FTD. Family members of people with FTD should think about genetic counseling. What are the signs or symptoms? Signs and symptoms of FTD usually start between the ages of 55-65 years. Symptoms may include:  Impulsive, poorly controlled, inappropriate, or embarrassing behavior.  Lack of motivation and interest (apathy).  Irritability and agitation.  Neglect of personal hygiene.  Withdrawal.  Inappropriate crying or laughing (pseudobulbar affect).  Repetitive behaviors.  Impulsive eating  Lack of concern for others.  Failure to recognize behavioral changes.  Loss of the ability to speak fluently.  Inability to write or read.  Slurred speech.  Loss of vocabulary.  New drug or alcohol abuse. Short-term memory loss is also a symptom later in the disease. How is this diagnosed? Your health care provider may suspect FTD if you have worsening behavior or speech difficulties. You may need to see specialists in brain and behavioral health who will collect your medical history and do a neurological examination. The following tests may be done:  Blood tests to rule out other causes, like vitamin deficiency, harmful effects of substances (toxicities), and infections.  Spinal tap (lumbar puncture) to check spinal fluid samples for abnormal proteins.  Imaging tests, such as a CT scan, PET scan, or MRI. These can  show brain changes that suggest FTD or another brain disorder.  Memory testing (neuropsychological testing), which involves several hours of standardized tests to check the many functions of the brain. How is this treated? There is no cure for this condition and the progression of FTD cannot be  stopped. Support at home is the most important aspect of managing FTD. Ask about caregiving resources in your community. Management of this condition may include:  Antidepressants to help with apathy.  Medicines to treat pseudobulbar affect.  Sedative medicines to control aggressive or dangerous behavior.  Speech and language therapy.  Behavioral therapy.  Occupational therapy to help with home safety and activity. Institutional or supportive care may eventually be needed. Follow these instructions at home: Eating and drinking  Follow a healthy diet. Eat foods that are high in fiber, such as fresh fruits and vegetables, whole grains, and beans.  Avoid having too much sugar and caffeine in your diet.  Watch for signs of compulsive eating, which can lead to other health problems.  Do not drink alcohol.  Drink enough fluid to keep your urine pale yellow. Lifestyle  Keep a regular routine.  Avoid stress and new situations.  Avoid any activities that may trigger aggressive behavior.  Schedule regular, enjoyable, and supervised physical activity. General instructions  Take over-the-counter and prescription medicines only as told by your health care provider.  If you were given a bracelet that tracks your location, make sure you wear it.  Work with your health care provider to determine what you need help with and what your safety needs are.  Keep all follow-up visits, including any therapy visits, as told by your health care provider. This is important. Contact a health care provider if:  Your symptoms change or get worse.  It becomes more difficult or stressful to be cared for at home. Get help right away if:  It is no longer possible to be cared for at home.  You or your family members become concerned for your safety. Summary  Frontotemporal dementia (FTD) is a rare, progressive brain disorder that causes memory loss.  There is no cure for this condition and  the progression of FTD cannot be stopped.  Support at home is the most important aspect of managing FTD. Ask about caregiving resources in your community.  Work with your health care provider to determine what you need help with and what your safety needs are. This information is not intended to replace advice given to you by your health care provider. Make sure you discuss any questions you have with your health care provider. Document Released: 02/02/2002 Document Revised: 05/08/2016 Document Reviewed: 05/08/2016 Elsevier Interactive Patient Education  2019 [employer].

## 2018-04-23 LAB — URINE CULTURE
MICRO NUMBER:: 240752
SPECIMEN QUALITY:: ADEQUATE

## 2018-04-23 LAB — URINALYSIS, MICROSCOPIC ONLY
Bacteria, UA: NONE SEEN /HPF
Hyaline Cast: NONE SEEN /LPF
RBC / HPF: NONE SEEN /HPF (ref 0–2)

## 2018-04-23 MED ORDER — AZITHROMYCIN 250 MG PO TABS: ORAL_TABLET | ORAL | 0 refills | Status: AC

## 2018-04-29 ENCOUNTER — Ambulatory Visit: Payer: Medicare Other | Admitting: Osteopathic Medicine

## 2018-05-12 ENCOUNTER — Ambulatory Visit: Payer: Medicare Other | Admitting: Osteopathic Medicine

## 2018-05-16 MED ORDER — ONDANSETRON HCL 4 MG/2ML IJ SOLN
4.00 | INTRAMUSCULAR | Status: DC
Start: ? — End: 2018-05-16

## 2018-05-16 MED ORDER — BACLOFEN 10 MG PO TABS
10.00 | ORAL_TABLET | ORAL | Status: DC
Start: 2018-05-15 — End: 2018-05-16

## 2018-05-16 MED ORDER — GENERIC EXTERNAL MEDICATION
Status: DC
Start: ? — End: 2018-05-16

## 2018-05-16 MED ORDER — LABETALOL HCL 5 MG/ML IV SOLN
10.00 | INTRAVENOUS | Status: DC
Start: ? — End: 2018-05-16

## 2018-05-16 MED ORDER — INSULIN LISPRO 100 UNIT/ML ~~LOC~~ SOLN
1.00 | SUBCUTANEOUS | Status: DC
Start: 2018-05-15 — End: 2018-05-16

## 2018-05-16 MED ORDER — THERA PO TABS
1.00 | ORAL_TABLET | ORAL | Status: DC
Start: 2018-05-16 — End: 2018-05-16

## 2018-05-16 MED ORDER — HYDRALAZINE HCL 20 MG/ML IJ SOLN
10.00 | INTRAMUSCULAR | Status: DC
Start: ? — End: 2018-05-16

## 2018-05-16 MED ORDER — ACETAMINOPHEN 325 MG PO TABS
650.00 | ORAL_TABLET | ORAL | Status: DC
Start: ? — End: 2018-05-16

## 2018-05-16 MED ORDER — INSULIN GLARGINE 100 UNIT/ML ~~LOC~~ SOLN
1.00 | SUBCUTANEOUS | Status: DC
Start: ? — End: 2018-05-16

## 2018-05-16 MED ORDER — PRAVASTATIN SODIUM 40 MG PO TABS
40.00 | ORAL_TABLET | ORAL | Status: DC
Start: 2018-05-15 — End: 2018-05-16

## 2018-05-16 MED ORDER — SODIUM CHLORIDE 0.9 % IV SOLN
10.00 | INTRAVENOUS | Status: DC
Start: ? — End: 2018-05-16

## 2018-05-16 MED ORDER — PREGABALIN 75 MG PO CAPS
75.00 | ORAL_CAPSULE | ORAL | Status: DC
Start: 2018-05-15 — End: 2018-05-16

## 2018-05-16 MED ORDER — BISACODYL 5 MG PO TBEC
10.00 | DELAYED_RELEASE_TABLET | ORAL | Status: DC
Start: ? — End: 2018-05-16

## 2018-05-16 MED ORDER — DULOXETINE HCL 30 MG PO CPEP
60.00 | ORAL_CAPSULE | ORAL | Status: DC
Start: 2018-05-15 — End: 2018-05-16

## 2018-05-16 MED ORDER — ROPINIROLE HCL 1 MG PO TABS
2.00 | ORAL_TABLET | ORAL | Status: DC
Start: 2018-05-15 — End: 2018-05-16

## 2018-05-16 MED ORDER — LORATADINE 10 MG PO TABS
10.00 | ORAL_TABLET | ORAL | Status: DC
Start: 2018-05-16 — End: 2018-05-16

## 2018-05-16 MED ORDER — METOPROLOL SUCCINATE ER 25 MG PO TB24
25.00 | ORAL_TABLET | ORAL | Status: DC
Start: 2018-05-16 — End: 2018-05-16

## 2018-05-16 MED ORDER — DOCUSATE SODIUM 100 MG PO CAPS
100.00 | ORAL_CAPSULE | ORAL | Status: DC
Start: 2018-05-15 — End: 2018-05-16

## 2018-05-16 MED ORDER — INSULIN LISPRO 100 UNIT/ML ~~LOC~~ SOLN
1.00 | SUBCUTANEOUS | Status: DC
Start: ? — End: 2018-05-16

## 2018-05-16 MED ORDER — INSULIN GLARGINE 100 UNIT/ML ~~LOC~~ SOLN
1.00 | SUBCUTANEOUS | Status: DC
Start: 2018-05-15 — End: 2018-05-16

## 2018-05-16 MED ORDER — POLYETHYLENE GLYCOL 3350 17 G PO PACK
17.00 | PACK | ORAL | Status: DC
Start: 2018-05-16 — End: 2018-05-16

## 2018-07-04 MED ORDER — PREGABALIN 75 MG PO CAPS
75.00 | ORAL_CAPSULE | ORAL | Status: DC
Start: 2018-07-03 — End: 2018-07-04

## 2018-07-04 MED ORDER — HYDRALAZINE HCL 20 MG/ML IJ SOLN
5.00 | INTRAMUSCULAR | Status: DC
Start: ? — End: 2018-07-04

## 2018-07-04 MED ORDER — ROPINIROLE HCL 1 MG PO TABS
2.00 | ORAL_TABLET | ORAL | Status: DC
Start: 2018-07-03 — End: 2018-07-04

## 2018-07-04 MED ORDER — ONDANSETRON HCL 4 MG/2ML IJ SOLN
4.00 | INTRAMUSCULAR | Status: DC
Start: ? — End: 2018-07-04

## 2018-07-04 MED ORDER — SODIUM CHLORIDE 0.9 % IV SOLN
10.00 | INTRAVENOUS | Status: DC
Start: ? — End: 2018-07-04

## 2018-07-04 MED ORDER — PAROXETINE HCL 20 MG PO TABS
40.00 | ORAL_TABLET | ORAL | Status: DC
Start: 2018-07-04 — End: 2018-07-04

## 2018-07-04 MED ORDER — HEPARIN SODIUM (PORCINE) 5000 UNIT/ML IJ SOLN
5000.00 | INTRAMUSCULAR | Status: DC
Start: 2018-07-03 — End: 2018-07-04

## 2018-07-04 MED ORDER — ASPIRIN 325 MG PO TABS
325.00 | ORAL_TABLET | ORAL | Status: DC
Start: 2018-07-04 — End: 2018-07-04

## 2018-07-04 MED ORDER — INSULIN LISPRO 100 UNIT/ML ~~LOC~~ SOLN
1.00 | SUBCUTANEOUS | Status: DC
Start: 2018-07-03 — End: 2018-07-04

## 2018-07-04 MED ORDER — INSULIN GLARGINE 100 UNIT/ML ~~LOC~~ SOLN
1.00 | SUBCUTANEOUS | Status: DC
Start: 2018-07-03 — End: 2018-07-04

## 2018-07-04 MED ORDER — GENERIC EXTERNAL MEDICATION
Status: DC
Start: ? — End: 2018-07-04

## 2018-07-04 MED ORDER — GENERIC EXTERNAL MEDICATION
2.00 | Status: DC
Start: 2018-07-04 — End: 2018-07-04

## 2018-07-04 MED ORDER — INSULIN GLARGINE 100 UNIT/ML ~~LOC~~ SOLN
1.00 | SUBCUTANEOUS | Status: DC
Start: ? — End: 2018-07-04

## 2018-07-04 MED ORDER — LORATADINE 10 MG PO TABS
10.00 | ORAL_TABLET | ORAL | Status: DC
Start: 2018-07-04 — End: 2018-07-04

## 2018-07-04 MED ORDER — DIPHENHYDRAMINE HCL 50 MG/ML IJ SOLN
25.00 | INTRAMUSCULAR | Status: DC
Start: ? — End: 2018-07-04

## 2018-07-04 MED ORDER — PNEUMOCOCCAL 13-VAL CONJ VACC IM SUSP
0.50 | INTRAMUSCULAR | Status: DC
Start: ? — End: 2018-07-04

## 2018-07-04 MED ORDER — POLYSACCHARIDE IRON COMPLEX 150 MG PO CAPS
150.00 | ORAL_CAPSULE | ORAL | Status: DC
Start: 2018-07-03 — End: 2018-07-04

## 2018-07-04 MED ORDER — DULOXETINE HCL 60 MG PO CPEP
60.00 | ORAL_CAPSULE | ORAL | Status: DC
Start: 2018-07-04 — End: 2018-07-04

## 2018-07-04 MED ORDER — PRAVASTATIN SODIUM 40 MG PO TABS
40.00 | ORAL_TABLET | ORAL | Status: DC
Start: 2018-07-03 — End: 2018-07-04

## 2018-07-04 MED ORDER — METOPROLOL SUCCINATE ER 50 MG PO TB24
50.00 | ORAL_TABLET | ORAL | Status: DC
Start: 2018-07-04 — End: 2018-07-04

## 2018-07-04 MED ORDER — ALUM & MAG HYDROXIDE-SIMETH 200-200-20 MG/5ML PO SUSP
15.00 | ORAL | Status: DC
Start: ? — End: 2018-07-04

## 2018-07-04 MED ORDER — BACLOFEN 10 MG PO TABS
5.00 | ORAL_TABLET | ORAL | Status: DC
Start: 2018-07-03 — End: 2018-07-04

## 2018-07-04 MED ORDER — INSULIN LISPRO 100 UNIT/ML ~~LOC~~ SOLN
1.00 | SUBCUTANEOUS | Status: DC
Start: ? — End: 2018-07-04

## 2018-07-04 MED ORDER — VITAMIN D3 25 MCG (1000 UNIT) PO TABS
1000.00 | ORAL_TABLET | ORAL | Status: DC
Start: 2018-07-04 — End: 2018-07-04

## 2018-07-04 MED ORDER — LABETALOL HCL 5 MG/ML IV SOLN
20.00 | INTRAVENOUS | Status: DC
Start: ? — End: 2018-07-04

## 2018-07-04 MED ORDER — CALCIUM CARBONATE ANTACID 500 MG PO CHEW
1000.00 | CHEWABLE_TABLET | ORAL | Status: DC
Start: ? — End: 2018-07-04

## 2018-07-04 MED ORDER — ACETAMINOPHEN 325 MG PO TABS
650.00 | ORAL_TABLET | ORAL | Status: DC
Start: ? — End: 2018-07-04

## 2018-07-04 MED ORDER — DOCUSATE SODIUM 150 MG/15ML PO LIQD
100.00 | ORAL | Status: DC
Start: ? — End: 2018-07-04

## 2018-07-04 MED ORDER — SODIUM CHLORIDE 0.9 % IV SOLN
20.00 | INTRAVENOUS | Status: DC
Start: ? — End: 2018-07-04

## 2018-07-14 ENCOUNTER — Telehealth: Payer: Self-pay

## 2018-07-14 NOTE — Telephone Encounter (Signed)
[name] with Hospice called and states Stacy Mendoza passed away.

## 2018-07-23 ENCOUNTER — Ambulatory Visit: Payer: Medicare Other | Admitting: Osteopathic Medicine

## 2018-07-28 DEATH — deceased

## 2020-12-04 IMAGING — MR MR HEAD W/O CM
10 series · 48 of 48 positions shown · non-contrast
Comparison: None.

CLINICAL DATA: Visual and auditory hallucinations. Possible
dementia. Difficulty walking.

EXAM:
MRI HEAD WITHOUT CONTRAST
TECHNIQUE: Multiplanar, multiecho pulse sequences of the brain and surrounding
structures were obtained without intravenous contrast.

[Series 3: DWI · axial · 3.0mm · 1.20mm/px · z∈[-82,+74]mm · 5 of 55 slices shown (1 of 4)]
[im 1/55]
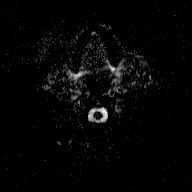
[im 14/55]
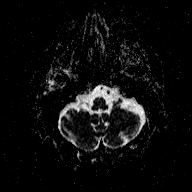
[im 28/55]
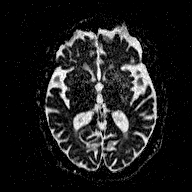
[im 41/55]
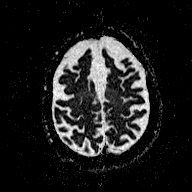
[im 55/55]
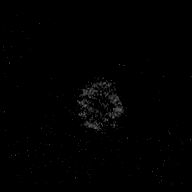

[Series 5: DWI · coronal · 3.0mm · 1.15mm/px · 5 of 50 slices shown (2 of 4)]
[im 1/50]
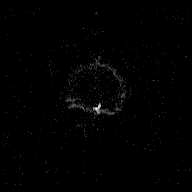
[im 13/50]
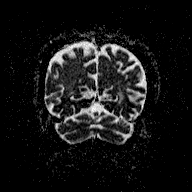
[im 25/50]
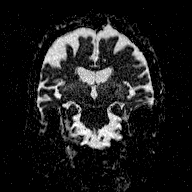
[im 37/50]
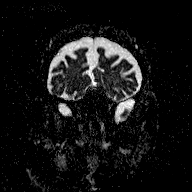
[im 50/50]
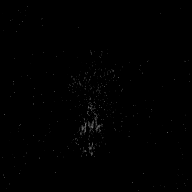

[Series 6: T1 · sagittal · 5.0mm · 0.45mm/px · 2 of 25 slices shown (1 of 2)]
[im 1/25]
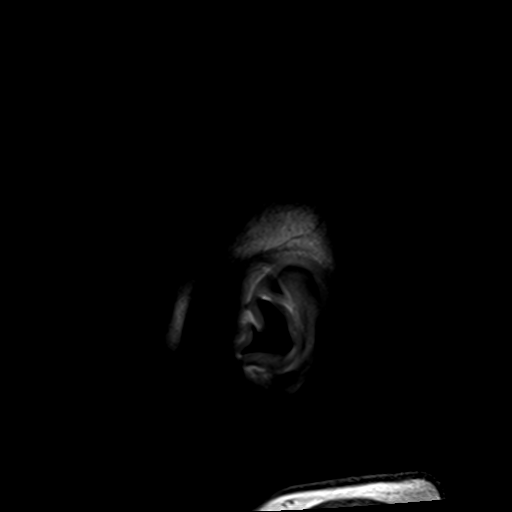
[im 25/25]
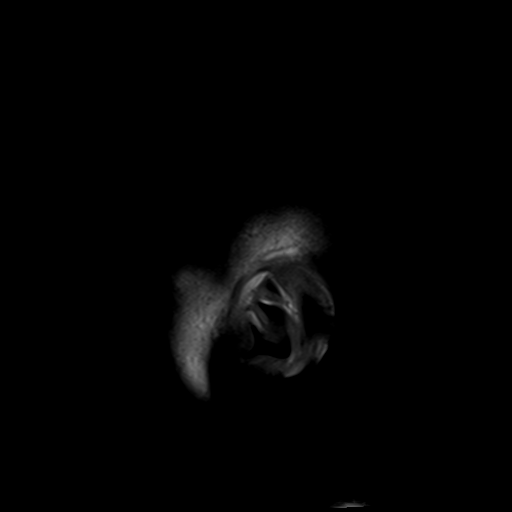

[Series 7: T2 · axial · 5.0mm · 0.72mm/px · z∈[-83,+78]mm · 2 of 25 slices shown (1 of 3)]
[im 1/25]
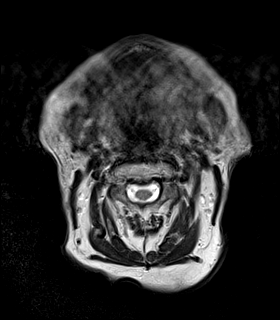
[im 25/25]
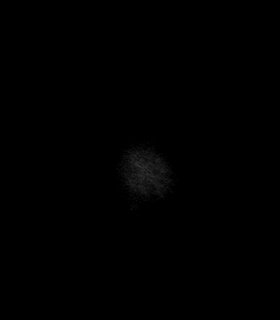

[Series 8: FLAIR · axial · 3.0mm · 0.45mm/px · z∈[-80,+75]mm · 5 of 55 slices shown]
[im 1/55]
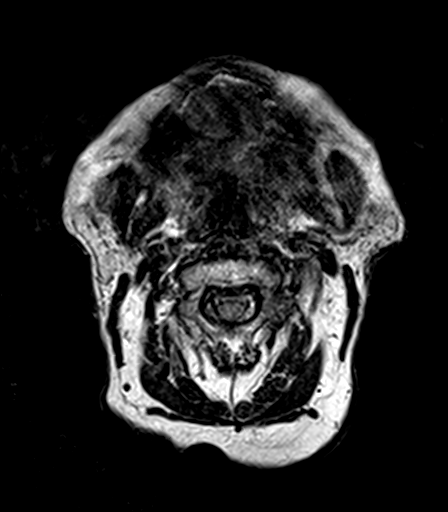
[im 14/55]
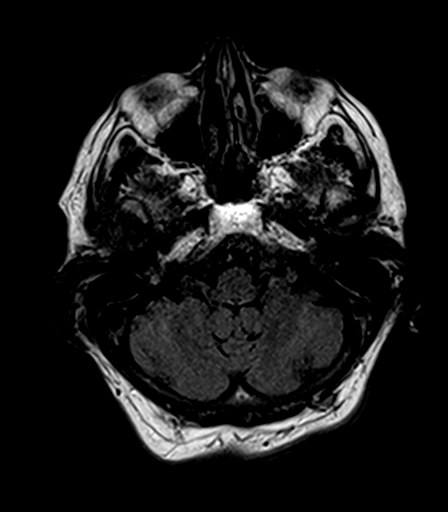
[im 28/55]
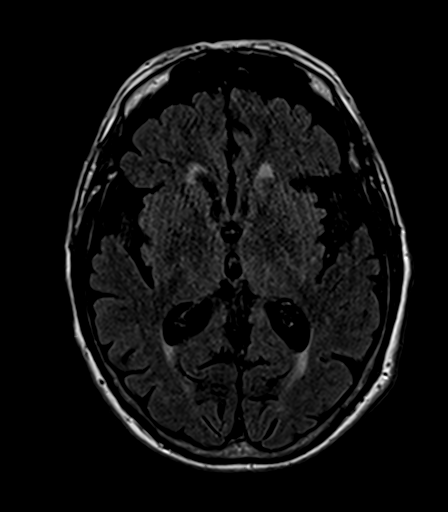
[im 41/55]
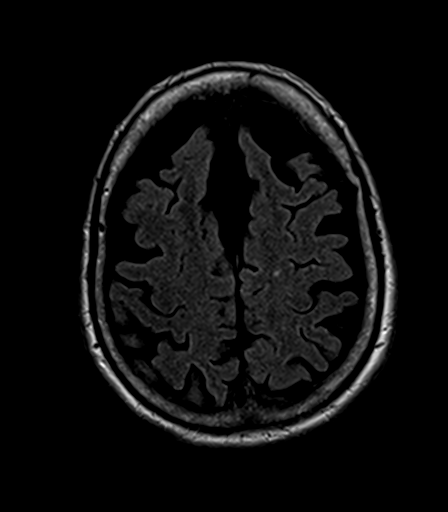
[im 55/55]
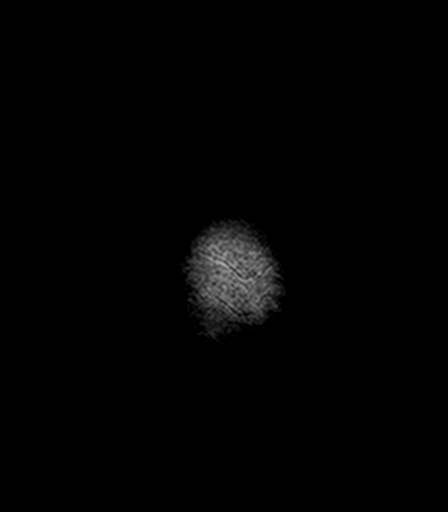

[Series 9: T2 · axial · 5.0mm · 0.72mm/px · z∈[-83,+78]mm · 2 of 25 slices shown (2 of 3)]
[im 1/25]
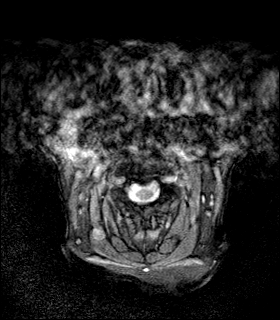
[im 25/25]
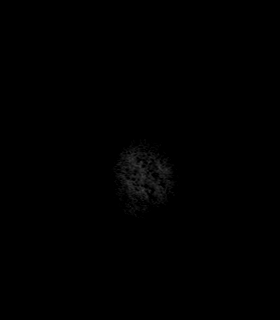

[Series 10: T1 · axial · 1.0mm · 1.00mm/px · z∈[-75,+77]mm · 14 of 160 slices shown (2 of 2)]
[im 1/160]
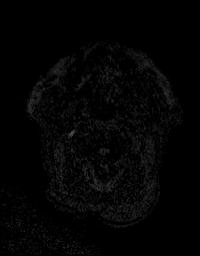
[im 13/160]
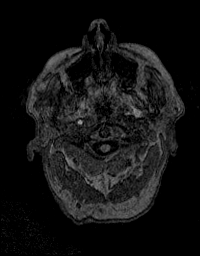
[im 25/160]
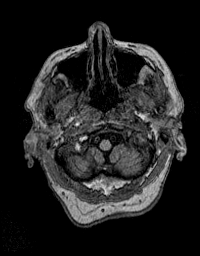
[im 37/160]
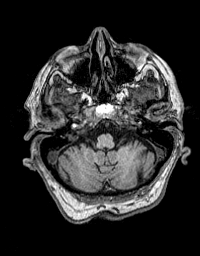
[im 49/160]
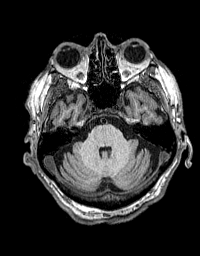
[im 62/160]
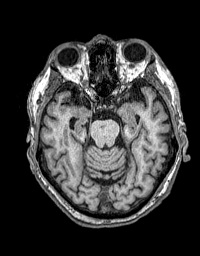
[im 74/160]
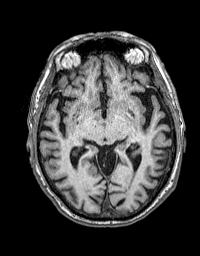
[im 86/160]
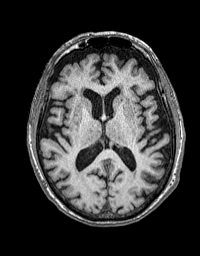
[im 98/160]
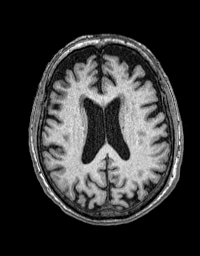
[im 111/160]
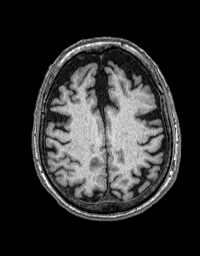
[im 123/160]
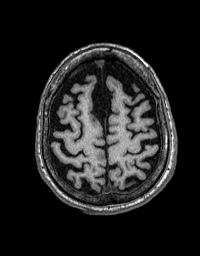
[im 135/160]
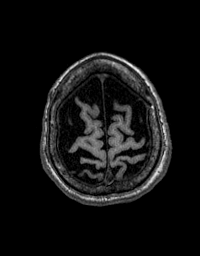
[im 147/160]
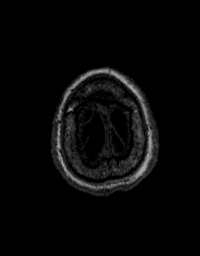
[im 160/160]
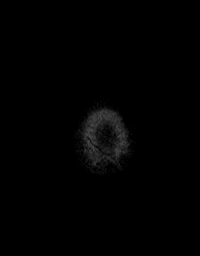

[Series 11: T2 · coronal · 5.0mm · 0.43mm/px · 3 of 31 slices shown (3 of 3)]
[im 1/31]
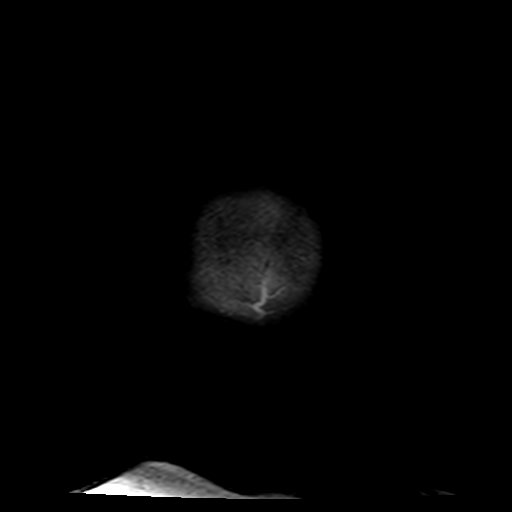
[im 16/31]
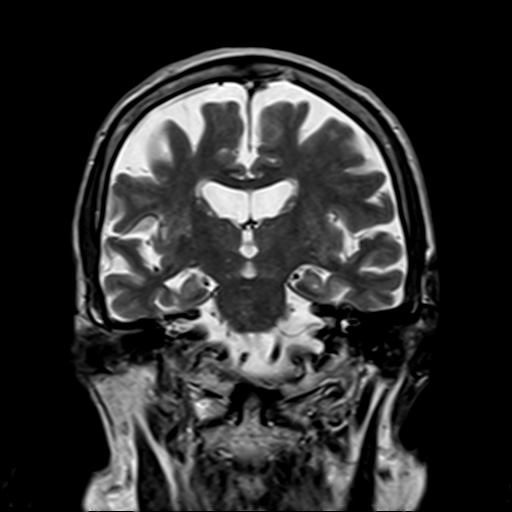
[im 31/31]
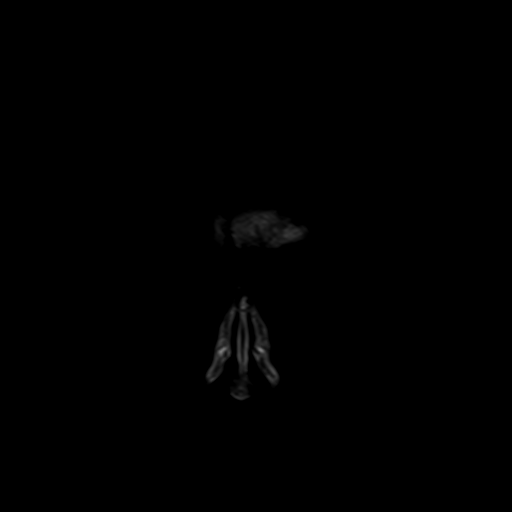

[Series 100: DWI · axial · 3.0mm · 1.20mm/px · z∈[-82,+74]mm · 5 of 55 slices shown (3 of 4)]
[im 1/55]
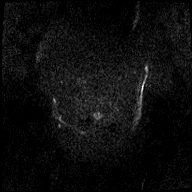
[im 14/55]
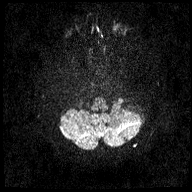
[im 28/55]
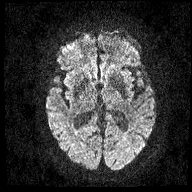
[im 41/55]
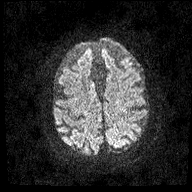
[im 55/55]
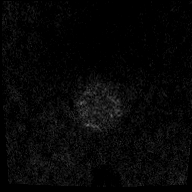

[Series 101: DWI · coronal · 3.0mm · 1.15mm/px · 5 of 50 slices shown (4 of 4)]
[im 1/50]
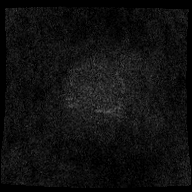
[im 13/50]
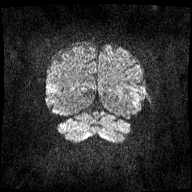
[im 25/50]
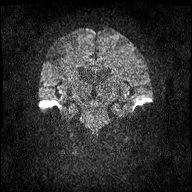
[im 37/50]
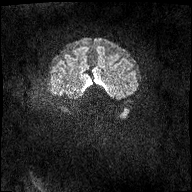
[im 50/50]
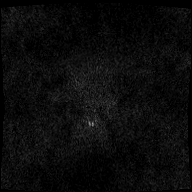

[48 of 48 positions shown; findings below may reference images not displayed]

FINDINGS: BRAIN: There is no acute infarct, acute hemorrhage, hydrocephalus or
extra-axial collection. The midline structures are normal. No
midline shift or other mass effect. There are no old infarcts. Early
confluent hyperintense T2-weighted signal of the periventricular and
deep white matter, most commonly due to chronic ischemic
microangiopathy. There is generalized volume loss with an apparent
frontotemporal predominance. Susceptibility-sensitive sequences show
no chronic microhemorrhage or superficial siderosis.

VASCULAR: Major intracranial arterial and venous sinus flow voids
are normal.

SKULL AND UPPER CERVICAL SPINE: Calvarial bone marrow signal is
normal. There is no skull base mass. Visualized upper cervical spine
and soft tissues are normal.

SINUSES/ORBITS: No fluid levels or advanced mucosal thickening. No
mastoid or middle ear effusion. The orbits are normal.
IMPRESSION: Chronic ischemic microangiopathy and volume loss most clearly
affecting the frontal and temporal lobes. Quantitative, volumetric
MRI of the brain may be helpful for more specific evaluation for
characteristic atrophy patterns associated with dementia.
